# Patient Record
Sex: Female | Born: 1965 | Race: White | Hispanic: No | Marital: Single | Smoking: Current every day smoker
Health system: Southern US, Community
[De-identification: ages and names within clinical notes are randomized; demographics above are authoritative.]

## PROBLEM LIST (undated history)

## (undated) DIAGNOSIS — E785 Hyperlipidemia, unspecified: Secondary | ICD-10-CM

## (undated) DIAGNOSIS — J449 Chronic obstructive pulmonary disease, unspecified: Secondary | ICD-10-CM

## (undated) DIAGNOSIS — I10 Essential (primary) hypertension: Secondary | ICD-10-CM

## (undated) DIAGNOSIS — E119 Type 2 diabetes mellitus without complications: Secondary | ICD-10-CM

---

## 2006-01-11 ENCOUNTER — Emergency Department: Payer: Self-pay | Admitting: Emergency Medicine

## 2006-08-14 ENCOUNTER — Emergency Department: Payer: Self-pay | Admitting: Emergency Medicine

## 2008-08-05 ENCOUNTER — Emergency Department: Payer: Self-pay | Admitting: Emergency Medicine

## 2021-07-05 ENCOUNTER — Ambulatory Visit
Admission: RE | Admit: 2021-07-05 | Discharge: 2021-07-05 | Disposition: A | Discharge status: Home / Self Care | Payer: Disability Insurance

## 2021-07-05 ENCOUNTER — Ambulatory Visit
Admission: RE | Admit: 2021-07-05 | Discharge: 2021-07-05 | Disposition: A | Discharge status: Home / Self Care | Payer: Disability Insurance | Source: Ambulatory Visit | Attending: *Deleted | Admitting: *Deleted

## 2021-07-05 ENCOUNTER — Other Ambulatory Visit: Payer: Self-pay | Admitting: *Deleted

## 2021-07-05 DIAGNOSIS — M549 Dorsalgia, unspecified: Secondary | ICD-10-CM

## 2021-12-16 IMAGING — CR DG LUMBAR SPINE 2-3V
1 series · 3 of 3 positions shown · non-contrast
Comparison: 08/05/2008

CLINICAL DATA: Back pain

EXAM:
LUMBAR SPINE - 2-3 VIEW

[Series 1: dg lumbar spine 2-3 views · 0.14mm/px · 3 of 3 slices shown]
[im 1/3]
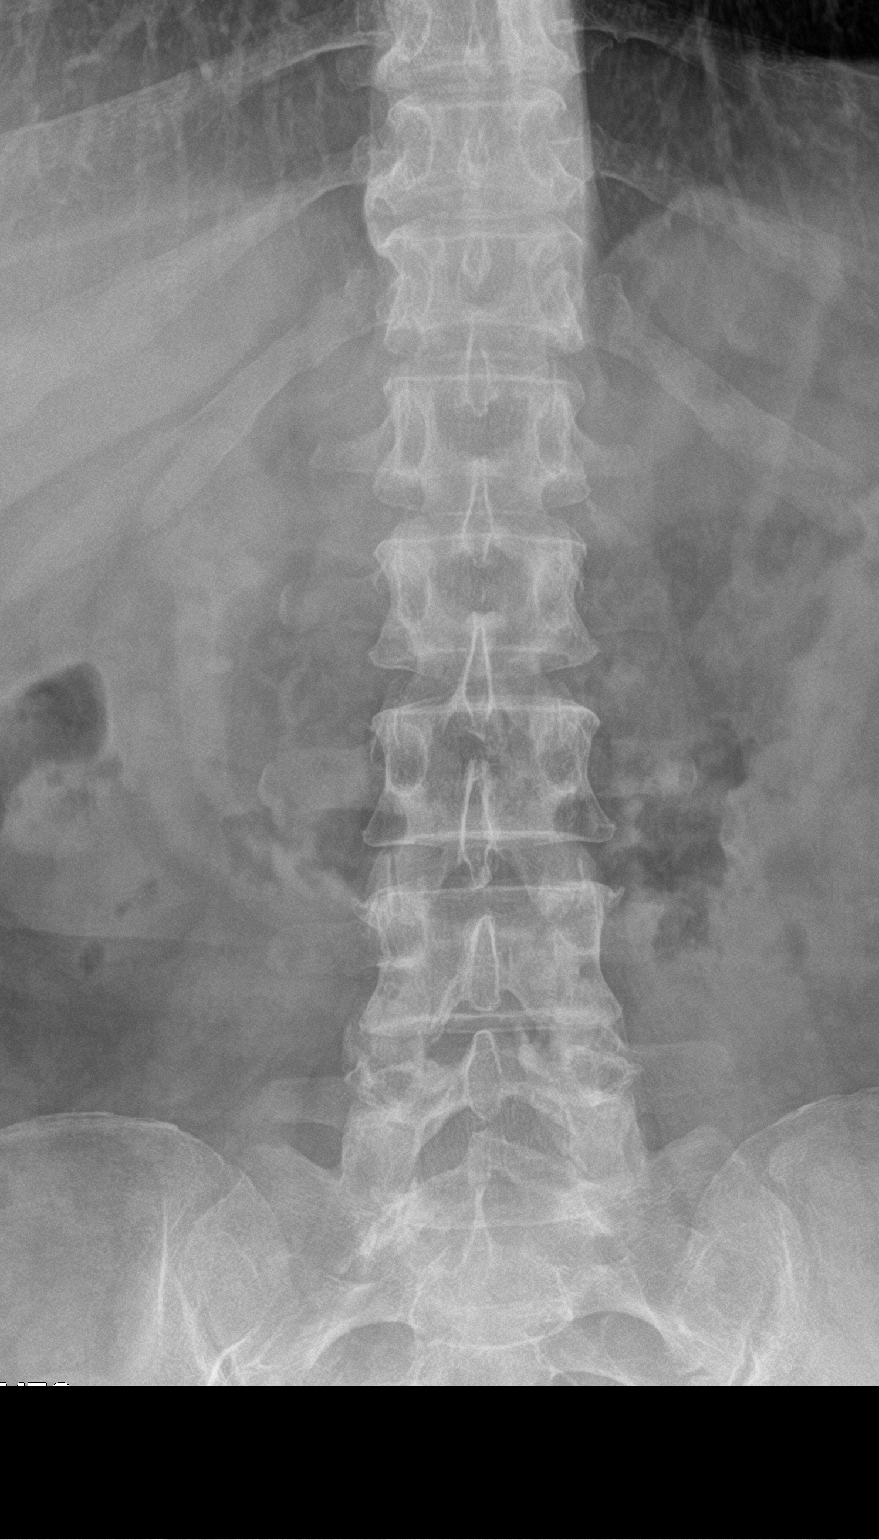
[im 2/3]
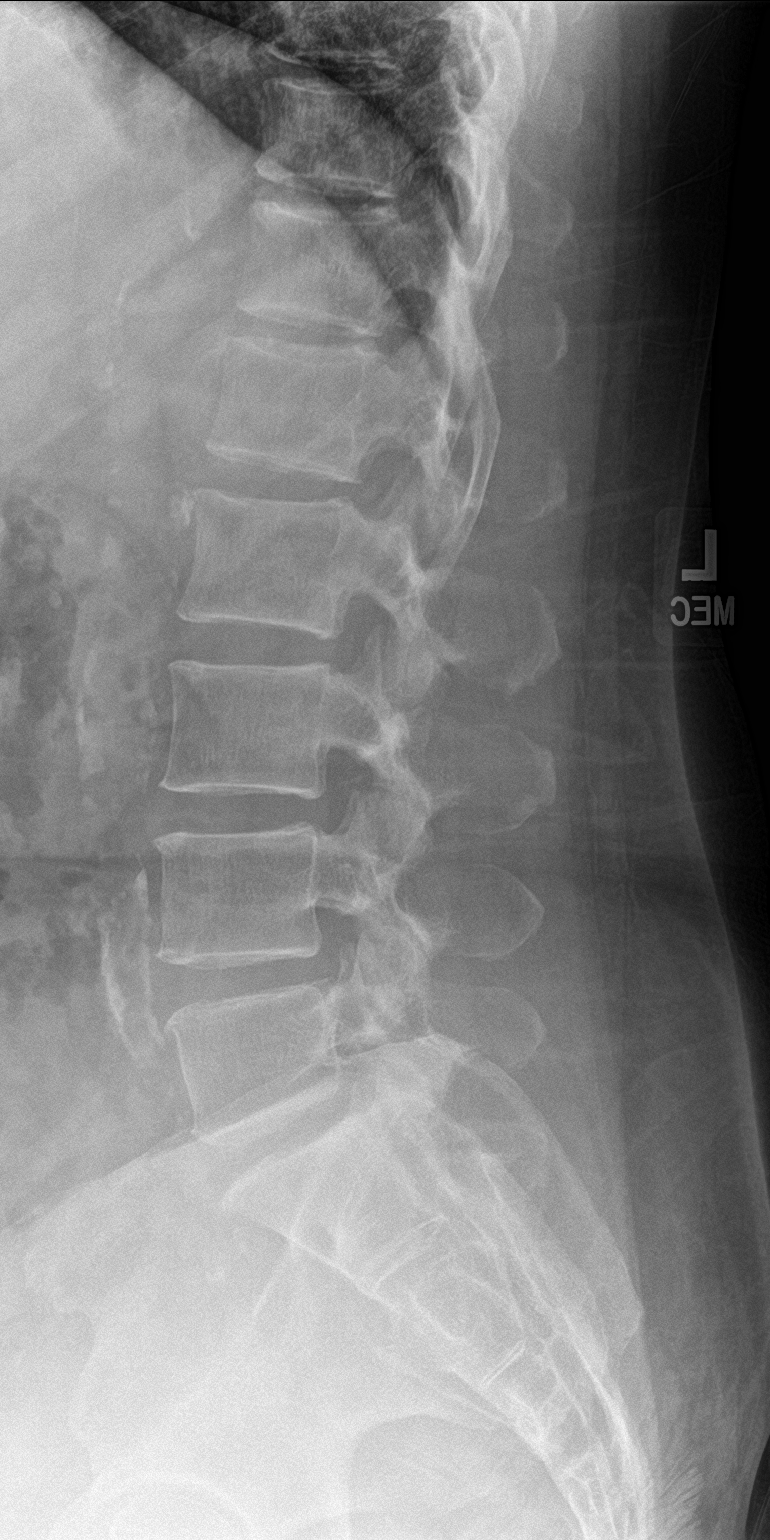
[im 3/3]
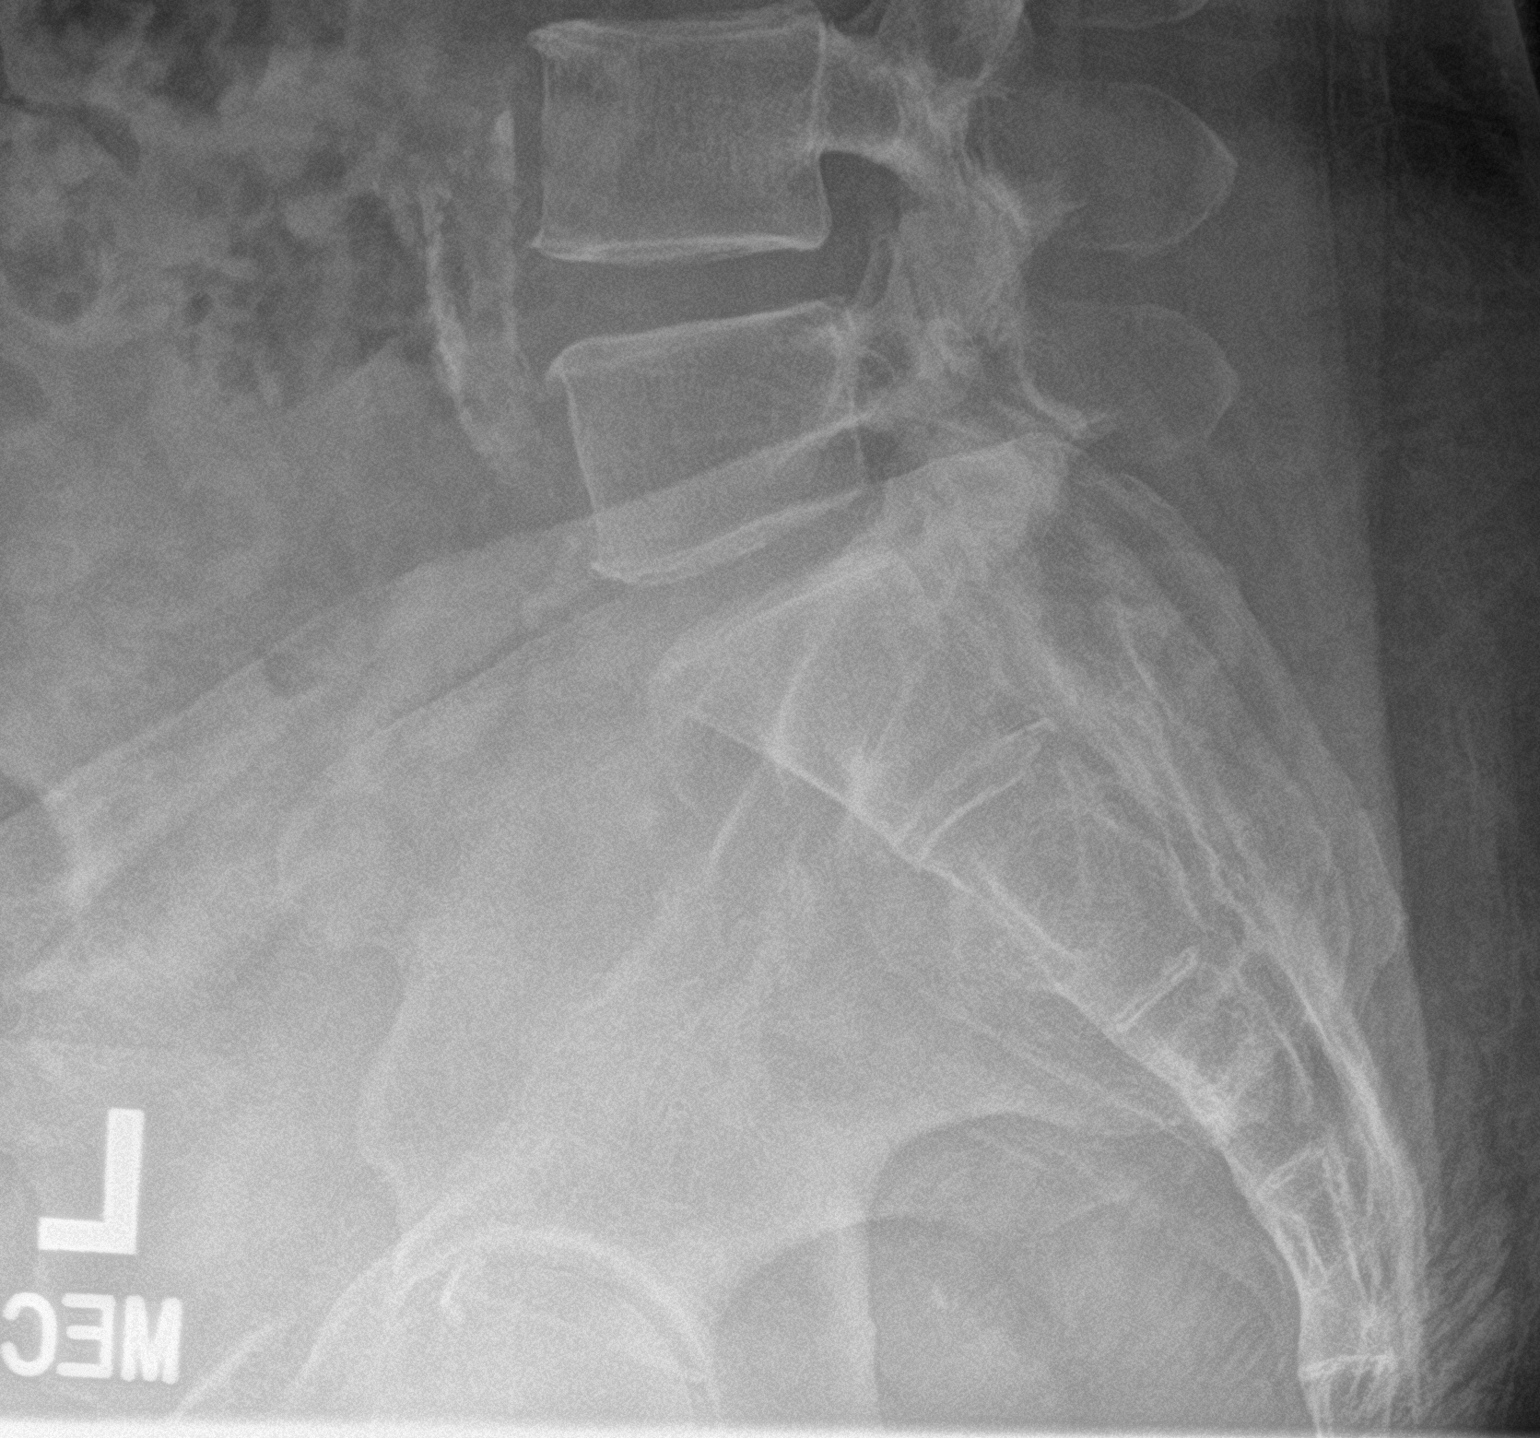

[3 of 3 positions shown; findings below may reference images not displayed]

FINDINGS: There is no evidence of lumbar spine fracture. Alignment is normal.
Minimal multilevel endplate osteophytosis without significant disc
space height loss. Mild multilevel facet degenerative change.
Nonobstructive pattern of overlying bowel gas. Aortic
atherosclerosis.
IMPRESSION: No fracture or dislocation of the lumbar spine. Minimal multilevel
endplate osteophytosis without significant disc space height loss.
Lumbar disc and neural foraminal pathology may be further evaluated
by MRI if indicated by neurologically localizing signs and symptoms.

## 2023-05-18 ENCOUNTER — Encounter: Payer: Self-pay | Admitting: Emergency Medicine

## 2023-05-18 ENCOUNTER — Ambulatory Visit
Admission: EM | Admit: 2023-05-18 | Discharge: 2023-05-18 | Disposition: A | Discharge status: Home / Self Care | Payer: Medicaid Other | Attending: Emergency Medicine | Admitting: Emergency Medicine

## 2023-05-18 DIAGNOSIS — J069 Acute upper respiratory infection, unspecified: Secondary | ICD-10-CM | POA: Insufficient documentation

## 2023-05-18 HISTORY — DX: Essential (primary) hypertension: I10

## 2023-05-18 HISTORY — DX: Type 2 diabetes mellitus without complications: E11.9

## 2023-05-18 HISTORY — DX: Hyperlipidemia, unspecified: E78.5

## 2023-05-18 LAB — GROUP A STREP BY PCR: Group A Strep by PCR: NOT DETECTED

## 2023-05-18 MED ORDER — PROMETHAZINE-DM 6.25-15 MG/5ML PO SYRP: 5.0000 mL | ORAL_SOLUTION | Freq: Four times a day (QID) | ORAL | 0 refills | Status: DC | PRN

## 2023-05-18 MED ORDER — AMOXICILLIN-POT CLAVULANATE 875-125 MG PO TABS: 1.0000 | ORAL_TABLET | Freq: Two times a day (BID) | ORAL | 0 refills | Status: AC

## 2023-05-18 MED ORDER — BENZONATATE 100 MG PO CAPS: 200.0000 mg | ORAL_CAPSULE | Freq: Three times a day (TID) | ORAL | 0 refills | Status: AC

## 2023-05-18 MED ORDER — IPRATROPIUM BROMIDE 0.06 % NA SOLN: 2.0000 | Freq: Four times a day (QID) | NASAL | 12 refills | Status: AC

## 2023-05-18 NOTE — ED Triage Notes (Signed)
Pt c/o cough, sore throat and left ear pain x 2 days.

## 2023-05-18 NOTE — ED Provider Notes (Addendum)
[facility]    CSN: [id number] Arrival date & time: 05/18/23  1033      History   Chief Complaint Chief Complaint  Patient presents with   Sore Throat   Otalgia   Cough    HPI Dana Webb is a 57 y.o. female.   HPI  57 year old female with a past medical history significant for diabetes, hypertension, hyperlipidemia, and COPD presents for evaluation of days worth of respiratory symptoms.  One of her symptoms is a sore throat and she does report a strep exposure through her 6-year-old grandson last week.  She also endorses pain in her left ear, nasal congestion, nonproductive cough, and wheezing.  She reports that the wheezing is normal for her and she takes her arroyo inhaler every day.  She has not had to use her rescue inhaler.  Past Medical History:  Diagnosis Date   Diabetes mellitus without complication (HCC)    Hyperlipidemia    Hypertension     There are no problems to display for this patient.   History reviewed. No pertinent surgical history.  OB History   No obstetric history on file.      Home Medications    Prior to Admission medications   Medication Sig Start Date End Date Taking? Authorizing Provider  albuterol (VENTOLIN HFA) 108 (90 Base) MCG/ACT inhaler Inhale into the lungs. 12/21/21  Yes [provider]  amoxicillin-clavulanate (AUGMENTIN) 875-125 MG tablet Take 1 tablet by mouth every 12 (twelve) hours for 10 days. 05/18/23 05/28/23 Yes Becky Augusta, NP  aspirin EC 81 MG tablet Take 1 tablet by mouth daily. 07/03/22  Yes [provider]  benzonatate (TESSALON) 100 MG capsule Take 2 capsules (200 mg total) by mouth every 8 (eight) hours. 05/18/23  Yes Becky Augusta, NP  clopidogrel (PLAVIX) 75 MG tablet Take 1 tablet by mouth daily. 07/03/22 08/01/23 Yes [provider]  gabapentin (NEURONTIN) 300 MG capsule Take 1 capsule by mouth every morning & 1 capsule by mouth in the afternoon, and 3 capsules (900 mg) at night  as needed for  neuropathy pain 05/01/23  Yes [provider]  hydrochlorothiazide (HYDRODIURIL) 25 MG tablet Take 1 tablet by mouth daily. 05/01/23 04/30/24 Yes [provider]  insulin glargine (LANTUS) 100 UNIT/ML injection Inject into the skin. 05/26/22 06/17/23 Yes [provider]  ipratropium (ATROVENT) 0.06 % nasal spray Place 2 sprays into both nostrils 4 (four) times daily. 05/18/23  Yes Becky Augusta, NP  nitroGLYCERIN (NITROSTAT) 0.4 MG SL tablet Place under the tongue. 06/30/22 06/30/23 Yes [provider]  ondansetron (ZOFRAN-ODT) 4 MG disintegrating tablet Take by mouth. 07/27/22  Yes [provider]  promethazine-dextromethorphan (PROMETHAZINE-DM) 6.25-15 MG/5ML syrup Take 5 mLs by mouth 4 (four) times daily as needed. 05/18/23  Yes Becky Augusta, NP  varenicline (CHANTIX) 1 MG tablet Take by mouth. 05/01/23 07/30/23 Yes [provider]  amitriptyline (ELAVIL) 25 MG tablet Take 25 mg by mouth at bedtime.    [provider]  Ernestina Patches 62.5-25 MCG/ACT AEPB Inhale 1 puff into the lungs daily.    [provider]  atorvastatin (LIPITOR) 80 MG tablet Take 80 mg by mouth daily.    [provider]  INVOKANA 300 MG TABS tablet Take 300 mg by mouth daily.    [provider]    Family History No family history on file.  Social History Social History   Tobacco Use   Smoking status: Every Day    Types: Cigarettes  Smokeless tobacco: Never  Vaping Use   Vaping Use: Never used  Substance Use Topics   Alcohol use: Not Currently   Drug use: Never     Allergies   Patient has no known allergies.   Review of Systems Review of Systems  Constitutional:  Negative for fever.  HENT:  Positive for congestion, ear pain and sore throat. Negative for rhinorrhea.   Respiratory:  Positive for cough and wheezing. Negative for shortness of breath.      Physical Exam Triage Vital Signs ED Triage Vitals [05/18/23 1045]   Enc Vitals Group     BP      Pulse      Resp      Temp      Temp src      SpO2      Weight      Height      Head Circumference      Peak Flow      Pain Score 5     Pain Loc      Pain Edu?      Excl. in GC?    No data found.  Updated Vital Signs BP 133/83 (BP Location: Left Arm)   Pulse 100   Temp 98 F (36.7 C) (Oral)   Resp 16   SpO2 98%   Visual Acuity Right Eye Distance:   Left Eye Distance:   Bilateral Distance:    Right Eye Near:   Left Eye Near:    Bilateral Near:     Physical Exam Vitals and nursing note reviewed.  Constitutional:      Appearance: Normal appearance. She is not ill-appearing.  HENT:     Head: Normocephalic and atraumatic.     Right Ear: Tympanic membrane, ear canal and external ear normal. There is no impacted cerumen.     Left Ear: Tympanic membrane, ear canal and external ear normal. There is no impacted cerumen.     Nose: Congestion and rhinorrhea present.     Comments: His mucosa is erythematous and edematous with clear discharge in both nares.    Mouth/Throat:     Mouth: Mucous membranes are moist.     Pharynx: Oropharynx is clear. Posterior oropharyngeal erythema present. No oropharyngeal exudate.     Comments: Bilateral tonsillar pillars and posterior pharynx are erythematous.  There is clear postnasal drip on exam. Cardiovascular:     Rate and Rhythm: Normal rate and regular rhythm.     Pulses: Normal pulses.     Heart sounds: Normal heart sounds. No murmur heard.    No friction rub. No gallop.  Pulmonary:     Effort: Pulmonary effort is normal.     Breath sounds: Normal breath sounds. No wheezing, rhonchi or rales.  Musculoskeletal:     Cervical back: Normal range of motion and neck supple.  Lymphadenopathy:     Cervical: No cervical adenopathy.  Skin:    General: Skin is warm and dry.     Capillary Refill: Capillary refill takes less than 2 seconds.  Neurological:     General: No focal deficit present.     Mental  Status: She is alert and oriented to person, place, and time.      UC Treatments / Results  Labs (all labs ordered are listed, but only abnormal results are displayed) Labs Reviewed  GROUP A STREP BY PCR    EKG   Radiology No results found.  Procedures Procedures (including critical care time)  Medications Ordered  in UC Medications - No data to display  Initial Impression / Assessment and Plan / UC Course  I have reviewed the triage vital signs and the nursing notes.  Pertinent labs & imaging results that were available during my care of the patient were reviewed by me and considered in my medical decision making (see chart for details).   Patient is a nontoxic-appearing 57 year old female presenting for evaluation of respiratory symptoms and sore throat in the setting of her recent strep exposure.  She does endorse a nonproductive cough and wheezing but she is able to speak in full sentence without dyspnea or tachypnea.  She is afebrile in clinic with an oral temp of 98.8.  Respiratory rate is 16 and room air oxygen saturation is 98%.  I will order a strep PCR given her strep exposure.  Strep PCR is negative.  Given the strep exposure and positive strep kids in the house I will cover with antibiotics.  I will start the patient on Augmentin 875 twice daily for 10 days.  I will also treat her other respiratory symptoms with Atrovent nasal spray, Tessalon Perles, and Promethazine DM cough syrup.   Final Clinical Impressions(s) / UC Diagnoses   Final diagnoses:  Viral URI with cough     Discharge Instructions      Your strep test today was negative but your physical exam does reveal evidence of an upper respiratory infection.  However, given your strep exposure we will treat you proactively with Augmentin 875 mg twice daily for 10 days with food.  Use over-the-counter Tylenol according to the package instructions as needed for any pain you might have.  You may also  gargle with warm salt water, 1 tablespoon of table salt in 8 ounces of warm water, gargle and spit.  You may do this as often as necessary to soothe your throat.  Over-the-counter Chloraseptic and/or Sucrets lozenges may also be helpful to soothe your throat as needed..  Use the Atrovent nasal spray, 2 squirts in each nostril every 6 hours, as needed for runny nose and postnasal drip.  Use the Tessalon Perles every 8 hours during the day.  Take them with a small sip of water.  They may give you some numbness to the base of your tongue or a metallic taste in your mouth, this is normal.  Use the Promethazine DM cough syrup at bedtime for cough and congestion.  It will make you drowsy so do not take it during the day.  Your previously prescribed medicines as directed.  Return for reevaluation or see your primary care provider for any new or worsening symptoms.      ED Prescriptions     Medication Sig Dispense Auth. Provider   benzonatate (TESSALON) 100 MG capsule Take 2 capsules (200 mg total) by mouth every 8 (eight) hours. 21 capsule Becky Augusta, NP   ipratropium (ATROVENT) 0.06 % nasal spray Place 2 sprays into both nostrils 4 (four) times daily. 15 mL Becky Augusta, NP   promethazine-dextromethorphan (PROMETHAZINE-DM) 6.25-15 MG/5ML syrup Take 5 mLs by mouth 4 (four) times daily as needed. 118 mL Becky Augusta, NP   amoxicillin-clavulanate (AUGMENTIN) 875-125 MG tablet Take 1 tablet by mouth every 12 (twelve) hours for 10 days. 20 tablet Becky Augusta, NP      PDMP not reviewed this encounter.   Becky Augusta, NP 05/18/23 1126    Becky Augusta, NP 05/18/23 1130

## 2023-05-18 NOTE — Discharge Instructions (Addendum)
Your strep test today was negative but your physical exam does reveal evidence of an upper respiratory infection.  However, given your strep exposure we will treat you proactively with Augmentin 875 mg twice daily for 10 days with food.  Use over-the-counter Tylenol according to the package instructions as needed for any pain you might have.  You may also gargle with warm salt water, 1 tablespoon of table salt in 8 ounces of warm water, gargle and spit.  You may do this as often as necessary to soothe your throat.  Over-the-counter Chloraseptic and/or Sucrets lozenges may also be helpful to soothe your throat as needed..  Use the Atrovent nasal spray, 2 squirts in each nostril every 6 hours, as needed for runny nose and postnasal drip.  Use the Tessalon Perles every 8 hours during the day.  Take them with a small sip of water.  They may give you some numbness to the base of your tongue or a metallic taste in your mouth, this is normal.  Use the Promethazine DM cough syrup at bedtime for cough and congestion.  It will make you drowsy so do not take it during the day.  Your previously prescribed medicines as directed.  Return for reevaluation or see your primary care provider for any new or worsening symptoms.

## 2024-07-24 LAB — COLOGUARD

## 2024-08-08 LAB — COLOGUARD: COLOGUARD: NEGATIVE

## 2024-09-02 ENCOUNTER — Ambulatory Visit: Admission: EM | Admit: 2024-09-02 | Discharge: 2024-09-02 | Disposition: A | Discharge status: Home / Self Care

## 2024-09-02 DIAGNOSIS — J441 Chronic obstructive pulmonary disease with (acute) exacerbation: Secondary | ICD-10-CM

## 2024-09-02 DIAGNOSIS — R5383 Other fatigue: Secondary | ICD-10-CM

## 2024-09-02 DIAGNOSIS — R051 Acute cough: Secondary | ICD-10-CM | POA: Diagnosis not present

## 2024-09-02 HISTORY — DX: Chronic obstructive pulmonary disease, unspecified: J44.9

## 2024-09-02 MED ORDER — PROMETHAZINE-DM 6.25-15 MG/5ML PO SYRP: 5.0000 mL | ORAL_SOLUTION | Freq: Four times a day (QID) | ORAL | 0 refills | Status: AC | PRN

## 2024-09-02 MED ORDER — PREDNISONE 20 MG PO TABS: 40.0000 mg | ORAL_TABLET | Freq: Every day | ORAL | 0 refills | Status: AC

## 2024-09-02 MED ORDER — AMOXICILLIN-POT CLAVULANATE 875-125 MG PO TABS: 1.0000 | ORAL_TABLET | Freq: Two times a day (BID) | ORAL | 0 refills | Status: AC

## 2024-09-02 NOTE — ED Provider Notes (Signed)
 [facility]    CSN: [id number] Arrival date & time: 09/02/24  1300      History   Chief Complaint Chief Complaint  Patient presents with   Cough    HPI Dana Webb is a 58 y.o. female with a history of diabetes, COPD, hypertension and hyperlipidemia.  She presents with her grandchildren today for continued cough and congestion as well as fatigue over the past 9 days since testing positive for COVID-19.  Patient has not had any fevers.  Denies chest pain or shortness of breath.  Cough is occasionally productive.  Reports not feeling any better from onset.  Has been taking over-the-counter cough medicine and using inhaler as needed.  HPI  Past Medical History:  Diagnosis Date   COPD (chronic obstructive pulmonary disease) (HCC)    Diabetes mellitus without complication (HCC)    Hyperlipidemia    Hypertension     There are no active problems to display for this patient.   History reviewed. No pertinent surgical history.  OB History   No obstetric history on file.      Home Medications    Prior to Admission medications   Medication Sig Start Date End Date Taking? Authorizing Provider  amoxicillin -clavulanate (AUGMENTIN ) 875-125 MG tablet Take 1 tablet by mouth every 12 (twelve) hours for 7 days. 09/02/24 09/09/24 Yes Arvis Jolan NOVAK, PA-C  atorvastatin (LIPITOR) 80 MG tablet Take 80 mg by mouth daily.   Yes [provider]  clopidogrel (PLAVIX) 75 MG tablet Take 75 mg by mouth daily. 08/02/24 07/28/25 Yes [provider]  empagliflozin (JARDIANCE) 25 MG TABS tablet Take 25 mg by mouth daily. 08/02/24  Yes [provider]  gabapentin (NEURONTIN) 300 MG capsule Take 1 capsule by mouth every morning & 1 capsule by mouth in the afternoon, and 3 capsules (900 mg) at night as needed for  neuropathy pain 05/01/23  Yes [provider]  hydrochlorothiazide (HYDRODIURIL) 25 MG tablet Take 1 tablet by mouth daily. 05/01/23 09/02/24 Yes [provider]  insulin glargine (LANTUS) 100 UNIT/ML injection Inject into the skin. 05/26/22 09/02/24 Yes [provider]  insulin lispro (HUMALOG) 100 UNIT/ML injection Inject 10 Units into the skin. 02/21/24 02/20/25 Yes [provider]  predniSONE  (DELTASONE ) 20 MG tablet Take 2 tablets (40 mg total) by mouth daily for 5 days. 09/02/24 09/07/24 Yes Arvis Jolan B, PA-C  promethazine -dextromethorphan (PROMETHAZINE -DM) 6.25-15 MG/5ML syrup Take 5 mLs by mouth 4 (four) times daily as needed. 09/02/24  Yes Arvis Jolan B, PA-C  venlafaxine XR (EFFEXOR-XR) 37.5 MG 24 hr capsule Take 37.5 mg by mouth. 07/04/24 07/04/25 Yes [provider]  albuterol (VENTOLIN HFA) 108 (90 Base) MCG/ACT inhaler Inhale into the lungs. 12/21/21   [provider]  amitriptyline (ELAVIL) 25 MG tablet Take 25 mg by mouth at bedtime.    [provider]  VIVIA BECK 62.5-25 MCG/ACT AEPB Inhale 1 puff into the lungs daily.    [provider]  aspirin EC 81 MG tablet Take 1 tablet by mouth daily. 07/03/22   [provider]  benzonatate  (TESSALON ) 100 MG capsule Take 2 capsules (200 mg total) by mouth every 8 (eight) hours. 05/18/23   Bernardino Ditch, NP  INVOKANA 300 MG TABS tablet Take 300 mg by mouth daily.    [provider]  ipratropium (ATROVENT ) 0.06 % nasal spray Place 2 sprays into both nostrils 4 (four) times daily. 05/18/23   Bernardino Ditch, NP  nitroGLYCERIN (NITROSTAT) 0.4 MG  SL tablet Place under the tongue. 06/30/22 06/30/23  [provider]  ondansetron (ZOFRAN-ODT) 4 MG disintegrating tablet Take by mouth. 07/27/22   [provider]    Family History History reviewed. No pertinent family history.  Social History Social History   Tobacco Use   Smoking status: Every Day    Types: Cigarettes   Smokeless tobacco: Never  Vaping Use   Vaping status: Never Used  Substance Use Topics   Alcohol use: Not Currently   Drug use: Never      Allergies   Patient has no known allergies.   Review of Systems Review of Systems  Constitutional:  Positive for fatigue. Negative for chills, diaphoresis and fever.  HENT:  Positive for congestion and rhinorrhea. Negative for ear pain, sinus pressure, sinus pain and sore throat.   Respiratory:  Positive for cough. Negative for shortness of breath and wheezing.   Cardiovascular:  Negative for chest pain.  Gastrointestinal:  Negative for abdominal pain, nausea and vomiting.  Musculoskeletal:  Negative for arthralgias and myalgias.  Skin:  Negative for rash.  Neurological:  Positive for headaches. Negative for weakness.  Hematological:  Negative for adenopathy.     Physical Exam Triage Vital Signs ED Triage Vitals  Encounter Vitals Group     BP      Girls Systolic BP Percentile      Girls Diastolic BP Percentile      Boys Systolic BP Percentile      Boys Diastolic BP Percentile      Pulse      Resp      Temp      Temp src      SpO2      Weight      Height      Head Circumference      Peak Flow      Pain Score      Pain Loc      Pain Education      Exclude from Growth Chart    No data found.  Updated Vital Signs BP (!) 135/93 (BP Location: Left Arm)   Pulse (!) 111   Temp 98.6 F (37 C) (Oral)   Resp 18   Wt 188 lb 11.2 oz (85.6 kg)   SpO2 94%       Physical Exam Vitals and nursing note reviewed.  Constitutional:      General: She is not in acute distress.    Appearance: Normal appearance. She is not ill-appearing or toxic-appearing.  HENT:     Head: Normocephalic and atraumatic.     Nose: Congestion present.     Mouth/Throat:     Mouth: Mucous membranes are moist.     Pharynx: Oropharynx is clear.  Eyes:     General: No scleral icterus.       Right eye: No discharge.        Left eye: No discharge.     Conjunctiva/sclera: Conjunctivae normal.  Cardiovascular:     Rate and Rhythm: Regular rhythm. Tachycardia present.     Heart sounds: Normal  heart sounds.  Pulmonary:     Effort: Pulmonary effort is normal. No respiratory distress.     Breath sounds: Wheezing present.  Musculoskeletal:     Cervical back: Neck supple.  Skin:    General: Skin is dry.  Neurological:     General: No focal deficit present.     Mental Status: She is alert. Mental status is at baseline.  Motor: No weakness.     Gait: Gait normal.  Psychiatric:        Mood and Affect: Mood normal.        Behavior: Behavior normal.      UC Treatments / Results  Labs (all labs ordered are listed, but only abnormal results are displayed) Labs Reviewed - No data to display  EKG   Radiology No results found.  Procedures Procedures (including critical care time)  Medications Ordered in UC Medications - No data to display  Initial Impression / Assessment and Plan / UC Course  I have reviewed the triage vital signs and the nursing notes.  Pertinent labs & imaging results that were available during my care of the patient were reviewed by me and considered in my medical decision making (see chart for details).   58 year old female with history of COPD, diabetes and hypertension presents for continued fatigue, cough, congestion and headaches after testing positive for COVID-19 9 days ago.  Using OTC meds and inhalers without relief.  Patient is afebrile.  Pulse elevated at 111 bpm.  Oxygen 94%.  No acute distress.  On exam has nasal congestion.  Throat clear.  Few scattered wheezes.  COPD exacerbation.  Treating at this time with prednisone , Augmentin  and Promethazine  DM.  Continue inhalers, rest and fluids.  Advised to return or seek reevaluation elsewhere if acute worsening of symptoms or not feeling better over the next week.  ED if uncontrolled fever, weakness or increased breathing problem.  Patient agreeable.   Final Clinical Impressions(s) / UC Diagnoses   Final diagnoses:  COPD exacerbation (HCC)  Acute cough  Other fatigue     Discharge  Instructions      - COPD flareup.  I sent cough medicine, corticosteroids and antibiotics. - Make sure to keep track of your blood sugars that can be elevated with prednisone  and you may need to take additional insulin. - Increase rest and fluids.  Use inhalers. - Go to ER if fever, worsening cough, chest pain or shortness of breath.     ED Prescriptions     Medication Sig Dispense Auth. Provider   promethazine -dextromethorphan (PROMETHAZINE -DM) 6.25-15 MG/5ML syrup Take 5 mLs by mouth 4 (four) times daily as needed. 118 mL Arvis Huxley B, PA-C   predniSONE  (DELTASONE ) 20 MG tablet Take 2 tablets (40 mg total) by mouth daily for 5 days. 10 tablet Arvis Huxley B, PA-C   amoxicillin -clavulanate (AUGMENTIN ) 875-125 MG tablet Take 1 tablet by mouth every 12 (twelve) hours for 7 days. 14 tablet Adalyn Pennock B, PA-C      PDMP not reviewed this encounter.   Arvis Huxley NOVAK, PA-C 09/02/24 1441

## 2024-09-02 NOTE — ED Triage Notes (Signed)
 Patient states that she wants a covid test to see if she's still positive. Patient states that she tested positive 9 days ago. Patient did a home test yesterday and was negative. Patient is concerned with her cough and headache

## 2024-09-02 NOTE — Discharge Instructions (Addendum)
-   COPD flareup.  I sent cough medicine, corticosteroids and antibiotics. - Make sure to keep track of your blood sugars that can be elevated with prednisone  and you may need to take additional insulin. - Increase rest and fluids.  Use inhalers. - Go to ER if fever, worsening cough, chest pain or shortness of breath.
